# Patient Record
Sex: Female | Born: 1990 | Race: Black or African American | Hispanic: No | Marital: Single | Smoking: Current every day smoker
Health system: Southern US, Community
[De-identification: ages and names within clinical notes are randomized; demographics above are authoritative.]

---
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
## 2015-09-15 ENCOUNTER — Encounter (HOSPITAL_COMMUNITY): Payer: Self-pay | Admitting: Emergency Medicine

## 2015-09-15 ENCOUNTER — Emergency Department (HOSPITAL_COMMUNITY)
Admission: EM | Admit: 2015-09-15 | Discharge: 2015-09-15 | Disposition: A | Discharge status: Home / Self Care | Payer: BLUE CROSS/BLUE SHIELD | Attending: Emergency Medicine | Admitting: Emergency Medicine

## 2015-09-15 DIAGNOSIS — F17200 Nicotine dependence, unspecified, uncomplicated: Secondary | ICD-10-CM | POA: Diagnosis not present

## 2015-09-15 DIAGNOSIS — K0889 Other specified disorders of teeth and supporting structures: Secondary | ICD-10-CM

## 2015-09-15 DIAGNOSIS — K0381 Cracked tooth: Secondary | ICD-10-CM | POA: Insufficient documentation

## 2015-09-15 MED ORDER — OXYCODONE-ACETAMINOPHEN 5-325 MG PO TABS: 1.0000 | ORAL_TABLET | ORAL | 0 refills | Status: AC | PRN

## 2015-09-15 MED ORDER — PENICILLIN V POTASSIUM 500 MG PO TABS: 500.0000 mg | ORAL_TABLET | Freq: Four times a day (QID) | ORAL | 0 refills | Status: AC

## 2015-09-15 NOTE — ED Triage Notes (Signed)
Pt stated, I've had a toothache for 2 days

## 2015-09-15 NOTE — ED Notes (Signed)
Declined W/C at D/C and was escorted to lobby by RN. 

## 2015-09-15 NOTE — ED Provider Notes (Signed)
[facility]-EMERGENCY DEPT Provider Note   CSN: [id number] Arrival date & time: 09/15/15  4098  By signing my name below, I, Christy Sartorius, attest that this documentation has been prepared under the direction and in the presence of  [employer], PA-C. Electronically Signed: Christy Sartorius, ED Scribe. 09/15/15. 11:09 AM.  History   Chief Complaint Chief Complaint  Patient presents with  . Dental Pain   The history is provided by the patient and medical records. No language interpreter was used.    HPI Comments:  Anita Maxwell is a 25 y.o. female who presents to the Emergency Department complaining of a gradually worsening toothache which began at the start of the week.  She's never had pain like this before and states it's kept her up for the last three nights.  She notes associated headache and neck pain.  She's tried ibuprofen, Asprin, B/C powder and oragel without relief.  She called the 24 hour dentist last night and states the soonest they could see her was on the 09/17/15 but she trurned down the appointment.    History reviewed. No pertinent past medical history.  There are no active problems to display for this patient.   History reviewed. No pertinent surgical history.  OB History    No data available       Home Medications    Prior to Admission medications   Medication Sig Start Date End Date Taking? Authorizing Provider  oxyCODONE-acetaminophen (PERCOCET/ROXICET) 5-325 MG tablet Take 1 tablet by mouth every 4 (four) hours as needed for severe pain. 09/15/15   Eyvonne Mechanic, PA-C  penicillin v potassium (VEETID) 500 MG tablet Take 1 tablet (500 mg total) by mouth 4 (four) times daily. 09/15/15 09/22/15  Eyvonne Mechanic, PA-C    Family History No family history on file.  Social History Social History  Substance Use Topics  . Smoking status: Current Every Day Smoker  . Smokeless tobacco: Never Used  . Alcohol use Yes     Allergies   Review of  patient's allergies indicates not on file.   Review of Systems Review of Systems  HENT: Positive for dental problem.   Musculoskeletal: Positive for neck pain.  Neurological: Positive for headaches.     Physical Exam Updated Vital Signs BP (!) 148/101 (BP Location: Left Arm)   Pulse 83   Temp 98.7 F (37.1 C) (Oral)   Resp 17   Ht 5' 5.5" (1.664 m)   Wt 71.7 kg   LMP 09/01/2015   SpO2 100%   BMI 25.89 kg/m   Physical Exam  Constitutional: She is oriented to person, place, and time. She appears well-developed and well-nourished. No distress.  HENT:  Head: Normocephalic and atraumatic.  Fractured right upper first molar.  Surrounding gum nontender with no swelling.  Floor of the mouth soft.  No other signs of infection.    Eyes: Conjunctivae are normal.  Cardiovascular: Normal rate.   Pulmonary/Chest: Effort normal.  Neurological: She is alert and oriented to person, place, and time.  Skin: Skin is warm and dry.  Psychiatric: She has a normal mood and affect.  Nursing note and vitals reviewed.    ED Treatments / Results   DIAGNOSTIC STUDIES:  Oxygen Saturation is 100% on RA, NML by my interpretation.    COORDINATION OF CARE:  11:09 AM Pt was advised to alternate taking ibuprofen, tylenol and percocet.  Will give her something for the pain today.  Discussed treatment plan with pt at bedside and pt  agreed to plan.  Labs (all labs ordered are listed, but only abnormal results are displayed) Labs Reviewed - No data to display  EKG  EKG Interpretation None       Radiology No results found.  Procedures Procedures (including critical care time)  Medications Ordered in ED Medications - No data to display   Initial Impression / Assessment and Plan / ED Course  I have reviewed the triage vital signs and the nursing notes.  Pertinent labs & imaging results that were available during my care of the patient were reviewed by me and considered in my medical  decision making (see chart for details).  Clinical Course     Patient with dentalgia.  No abscess requiring immediate incision and drainage.  Exam not concerning for Ludwig's angina or pharyngeal abscess.  Will treat with Penicillin. Pt instructed to follow-up with dentist.  Discussed return precautions. Pt safe for discharge.  Final Clinical Impressions(s) / ED Diagnoses   Final diagnoses:  Pain, dental    New Prescriptions Discharge Medication List as of 09/15/2015 10:58 AM    START taking these medications   Details  oxyCODONE-acetaminophen (PERCOCET/ROXICET) 5-325 MG tablet Take 1 tablet by mouth every 4 (four) hours as needed for severe pain., Starting Sat 09/15/2015, Print    penicillin v potassium (VEETID) 500 MG tablet Take 1 tablet (500 mg total) by mouth 4 (four) times daily., Starting Sat 09/15/2015, Until Sat 09/22/2015, Print       I personally performed the services described in this documentation, which was scribed in my presence. The recorded information has been reviewed and is accurate.    Eyvonne MechanicJeffrey Fahad Cisse, PA-C 09/15/15 1251    Benjiman CoreNathan Pickering, MD 09/15/15 [phone]

## 2015-09-15 NOTE — Discharge Instructions (Signed)
Please read attached information. If you experience any new or worsening signs or symptoms please return to the emergency room for evaluation. Please follow-up with your primary care provider or specialist as discussed. Please use medication prescribed only as directed and discontinue taking if you have any concerning signs or symptoms.   °
